# Patient Record
Sex: Male | Born: 1975 | Race: White | Hispanic: No | Marital: Single | Smoking: Current every day smoker
Health system: Southern US, Community
[De-identification: ages and names within clinical notes are randomized; demographics above are authoritative.]

## PROBLEM LIST (undated history)

## (undated) HISTORY — PX: HAND SURGERY: SHX662

## (undated) HISTORY — PX: TONSILLECTOMY: SUR1361

## (undated) HISTORY — PX: MANDIBLE SURGERY: SHX707

---

## 2003-01-12 ENCOUNTER — Emergency Department (HOSPITAL_COMMUNITY): Admission: EM | Admit: 2003-01-12 | Discharge: 2003-01-13 | Payer: Self-pay | Admitting: Emergency Medicine

## 2003-01-14 ENCOUNTER — Emergency Department (HOSPITAL_COMMUNITY): Admission: EM | Admit: 2003-01-14 | Discharge: 2003-01-14 | Payer: Self-pay | Admitting: Emergency Medicine

## 2007-06-06 ENCOUNTER — Encounter: Admission: RE | Admit: 2007-06-06 | Discharge: 2007-06-06 | Payer: Self-pay | Admitting: Family Medicine

## 2012-06-29 ENCOUNTER — Emergency Department: Payer: Self-pay | Admitting: Emergency Medicine

## 2012-06-29 LAB — CBC
HCT: 42.4 % (ref 40.0–52.0)
HGB: 14.5 g/dL (ref 13.0–18.0)
Platelet: 239 10*3/uL (ref 150–440)
RBC: 4.56 10*6/uL (ref 4.40–5.90)
RDW: 12.7 % (ref 11.5–14.5)

## 2012-06-30 LAB — COMPREHENSIVE METABOLIC PANEL
Alkaline Phosphatase: 79 U/L (ref 50–136)
Anion Gap: 4 — ABNORMAL LOW (ref 7–16)
Bilirubin,Total: 0.2 mg/dL (ref 0.2–1.0)
Co2: 26 mmol/L (ref 21–32)
EGFR (African American): >60
EGFR (Non-African Amer.): >60
Osmolality: 284 (ref 275–301)
SGOT(AST): 31 U/L (ref 15–37)
Sodium: 142 mmol/L (ref 136–145)

## 2012-06-30 LAB — URINALYSIS, COMPLETE
Bacteria: NONE SEEN
Bilirubin,UR: NEGATIVE
Glucose,UR: 150 mg/dL (ref 0–75)
Leukocyte Esterase: NEGATIVE
Ph: 8 (ref 4.5–8.0)
Protein: NEGATIVE
Specific Gravity: 1.01 (ref 1.003–1.030)
WBC UR: 2 /HPF (ref 0–5)

## 2012-06-30 LAB — TROPONIN I: Troponin-I: <0.02 ng/mL

## 2013-01-08 ENCOUNTER — Encounter (HOSPITAL_COMMUNITY): Payer: Self-pay | Admitting: Emergency Medicine

## 2013-01-08 ENCOUNTER — Emergency Department (HOSPITAL_COMMUNITY)
Admission: EM | Admit: 2013-01-08 | Discharge: 2013-01-08 | Disposition: A | Discharge status: Home / Self Care | Payer: 59 | Attending: Emergency Medicine | Admitting: Emergency Medicine

## 2013-01-08 DIAGNOSIS — Y9389 Activity, other specified: Secondary | ICD-10-CM | POA: Insufficient documentation

## 2013-01-08 DIAGNOSIS — T6391XA Toxic effect of contact with unspecified venomous animal, accidental (unintentional), initial encounter: Secondary | ICD-10-CM | POA: Insufficient documentation

## 2013-01-08 DIAGNOSIS — F17200 Nicotine dependence, unspecified, uncomplicated: Secondary | ICD-10-CM | POA: Insufficient documentation

## 2013-01-08 DIAGNOSIS — Y929 Unspecified place or not applicable: Secondary | ICD-10-CM | POA: Insufficient documentation

## 2013-01-08 DIAGNOSIS — R000 Tachycardia, unspecified: Secondary | ICD-10-CM | POA: Insufficient documentation

## 2013-01-08 DIAGNOSIS — Z91038 Other insect allergy status: Secondary | ICD-10-CM | POA: Insufficient documentation

## 2013-01-08 DIAGNOSIS — T63461A Toxic effect of venom of wasps, accidental (unintentional), initial encounter: Secondary | ICD-10-CM | POA: Insufficient documentation

## 2013-01-08 MED ORDER — TETANUS-DIPHTH-ACELL PERTUSSIS 5-2.5-18.5 LF-MCG/0.5 IM SUSP
0.5000 mL | Freq: Once | INTRAMUSCULAR | Status: AC
  Administered 2013-01-08: 0.5 mL via INTRAMUSCULAR
  Filled 2013-01-08: qty 0.5

## 2013-01-08 MED ORDER — EPINEPHRINE 0.3 MG/0.3ML IJ SOAJ: 0.3000 mg | INTRAMUSCULAR | Status: DC | PRN

## 2013-01-08 NOTE — ED Notes (Signed)
Pt from home, allergic to bees, was stung by yellow jacket approx PTA. Pt injected himself with epi-pen to R thigh when stung. Pt is A&O and in NAD

## 2013-01-08 NOTE — ED Provider Notes (Signed)
CSN: [id number]     Arrival date & time 01/08/13  1140 History   First MD Initiated Contact with Patient 01/08/13 1156     Chief Complaint  Patient presents with  . Insect Bite   (Consider location/radiation/quality/duration/timing/severity/associated sxs/prior Treatment) HPI Patient was stung by a bee at the dorsum of his right foot 20 minutes prior to arrival. He presently feels shaky no other complaint. No shortness of breath. No feeling of throat closing. No rash. He treated himself with an EpiPen immediately prior to coming here. History reviewed. No pertinent past medical history. Past Surgical History  Procedure Laterality Date  . Mandible surgery    . Hand surgery Right   . Tonsillectomy     anaphylactic reaction to bee sting No family history on file. History  Substance Use Topics  . Smoking status: Current Every Day Smoker -- 1.00 packs/day    Types: Cigarettes  . Smokeless tobacco: Not on file  . Alcohol Use: No   positive smoker no alcohol no drugs  Review of Systems  Constitutional: Negative.   HENT: Negative.   Respiratory: Negative.   Cardiovascular: Negative.   Gastrointestinal: Negative.   Musculoskeletal: Negative.   Skin: Negative.   Neurological: Negative.   Psychiatric/Behavioral: Negative.        Anxious  All other systems reviewed and are negative.    Allergies  Bee venom  Home Medications  No current outpatient prescriptions on file. BP 145/90  Pulse 115  Temp(Src) 98.4 F (36.9 C) (Axillary)  Resp 20  SpO2 100% Physical Exam  Nursing note and vitals reviewed. Constitutional: He is oriented to person, place, and time. He appears well-developed and well-nourished.  HENT:  Head: Normocephalic and atraumatic.  Eyes: Conjunctivae are normal. Pupils are equal, round, and reactive to light.  Neck: Neck supple. No tracheal deviation present. No thyromegaly present.  Cardiovascular: Regular rhythm.   No murmur heard. Mildly tachycardic   Pulmonary/Chest: Effort normal and breath sounds normal.  Abdominal: Soft. Bowel sounds are normal. He exhibits no distension. There is no tenderness.  Musculoskeletal: Normal range of motion. He exhibits no edema and no tenderness.  Neurological: He is alert and oriented to person, place, and time. Coordination normal.  Skin: Skin is warm and dry. No rash noted.  Is 1 mm punctate lesion over dorsum of right foot. No stinger present. No surrounding swelling redness or swelling. No stinger present  Psychiatric: He has a normal mood and affect.    ED Course  Procedures (including critical care time) Labs Review Labs Reviewed - No data to display Imaging Review No results found.  EKG Interpretation   None      3:10 PM patient is asymptomatic. Except for mild pain at right foot at site of bee sting. MDM  No diagnosis found. don't feel that patient sustained an anaphylactic reaction as result of today's bee sting Plan prescription EpiPen Referral wellness Center and resource guide to get primary care physician Mrs. bee sting of right foot    Doug Sou, MD 01/08/13 1514

## 2013-01-08 NOTE — ED Notes (Signed)
Pt escorted to discharge window. Pt verbalized understanding discharge instructions. In no acute distress.  

## 2013-01-08 NOTE — ED Notes (Signed)
Pt alert and oriented x4. Respirations even and unlabored, bilateral symmetrical rise and fall of chest. Skin warm and dry. In no acute distress. Denies needs.   

## 2015-12-17 ENCOUNTER — Emergency Department (HOSPITAL_BASED_OUTPATIENT_CLINIC_OR_DEPARTMENT_OTHER): Payer: Self-pay

## 2015-12-17 ENCOUNTER — Encounter (HOSPITAL_BASED_OUTPATIENT_CLINIC_OR_DEPARTMENT_OTHER): Payer: Self-pay | Admitting: Emergency Medicine

## 2015-12-17 ENCOUNTER — Emergency Department (HOSPITAL_BASED_OUTPATIENT_CLINIC_OR_DEPARTMENT_OTHER)
Admission: EM | Admit: 2015-12-17 | Discharge: 2015-12-17 | Disposition: A | Discharge status: Home / Self Care | Payer: Self-pay | Attending: Emergency Medicine | Admitting: Emergency Medicine

## 2015-12-17 ENCOUNTER — Inpatient Hospital Stay (HOSPITAL_BASED_OUTPATIENT_CLINIC_OR_DEPARTMENT_OTHER): Admit: 2015-12-17 | Payer: Self-pay

## 2015-12-17 DIAGNOSIS — E876 Hypokalemia: Secondary | ICD-10-CM

## 2015-12-17 DIAGNOSIS — R609 Edema, unspecified: Secondary | ICD-10-CM

## 2015-12-17 DIAGNOSIS — R600 Localized edema: Secondary | ICD-10-CM

## 2015-12-17 DIAGNOSIS — F17210 Nicotine dependence, cigarettes, uncomplicated: Secondary | ICD-10-CM | POA: Insufficient documentation

## 2015-12-17 DIAGNOSIS — Z79899 Other long term (current) drug therapy: Secondary | ICD-10-CM | POA: Insufficient documentation

## 2015-12-17 LAB — CBC WITH DIFFERENTIAL/PLATELET
BASOS ABS: 0.1 10*3/uL (ref 0.0–0.1)
BASOS PCT: 1 %
EOS ABS: 0.5 10*3/uL (ref 0.0–0.7)
Eosinophils Relative: 5 %
HEMATOCRIT: 37.1 % — AB (ref 39.0–52.0)
HEMOGLOBIN: 13.1 g/dL (ref 13.0–17.0)
Lymphocytes Relative: 37 %
Lymphs Abs: 3.9 10*3/uL (ref 0.7–4.0)
MCH: 33.2 pg (ref 26.0–34.0)
MCHC: 35.3 g/dL (ref 30.0–36.0)
MCV: 94.2 fL (ref 78.0–100.0)
MONOS PCT: 8 %
Monocytes Absolute: 0.9 10*3/uL (ref 0.1–1.0)
NEUTROS ABS: 5.1 10*3/uL (ref 1.7–7.7)
NEUTROS PCT: 49 %
Platelets: 386 10*3/uL (ref 150–400)
RBC: 3.94 MIL/uL — ABNORMAL LOW (ref 4.22–5.81)
RDW: 12 % (ref 11.5–15.5)
WBC: 10.4 10*3/uL (ref 4.0–10.5)

## 2015-12-17 LAB — BASIC METABOLIC PANEL
ANION GAP: 8 (ref 5–15)
BUN: 8 mg/dL (ref 6–20)
CALCIUM: 8.9 mg/dL (ref 8.9–10.3)
CO2: 26 mmol/L (ref 22–32)
CREATININE: 0.8 mg/dL (ref 0.61–1.24)
Chloride: 105 mmol/L (ref 101–111)
Glucose, Bld: 108 mg/dL — ABNORMAL HIGH (ref 65–99)
Potassium: 2.9 mmol/L — ABNORMAL LOW (ref 3.5–5.1)
SODIUM: 139 mmol/L (ref 135–145)

## 2015-12-17 MED ORDER — POTASSIUM CHLORIDE CRYS ER 20 MEQ PO TBCR: 20.0000 meq | EXTENDED_RELEASE_TABLET | Freq: Every day | ORAL | 0 refills | Status: DC

## 2015-12-17 MED ORDER — POTASSIUM CHLORIDE CRYS ER 20 MEQ PO TBCR
40.0000 meq | EXTENDED_RELEASE_TABLET | Freq: Once | ORAL | Status: AC
  Administered 2015-12-17: 40 meq via ORAL
  Filled 2015-12-17: qty 2

## 2015-12-17 MED ORDER — FUROSEMIDE 20 MG PO TABS: 20.0000 mg | ORAL_TABLET | Freq: Every day | ORAL | 0 refills | Status: DC

## 2015-12-17 NOTE — ED Provider Notes (Signed)
[facility]   CSN: [id number] Arrival date & time: 12/17/15  0258     History   Chief Complaint Chief Complaint  Patient presents with  . Leg Pain    HPI Erik Watson is a 40 y.o. male.  The history is provided by the patient.  Leg Pain   This is a new problem. The current episode started more than 2 days ago. The problem occurs daily. The problem has been gradually worsening. The pain is present in the left lower leg and right lower leg. The pain is moderate. He has tried nothing for the symptoms.   Patient reports bilateral LE swelling for one week He reports it is worsened with prolonged standing He reports he feels "swollen" from knees to ankles in both legs  He denies h/o CAD/PE/DVT/CHF  He also mentions intermittent blurred vision that has since resolved No CP/SOB reported but he reports frequent cough and difficulty lying flat.   PMH - none Past Surgical History:  Procedure Laterality Date  . HAND SURGERY Right   . MANDIBLE SURGERY    . TONSILLECTOMY         Home Medications    Prior to Admission medications   Medication Sig Start Date End Date Taking? Authorizing Provider  acetaminophen (TYLENOL) 500 MG tablet Take 1,000 mg by mouth every 6 (six) hours as needed for pain.    Historical Provider, MD  EPINEPHrine (EPI-PEN) 0.3 mg/0.3 mL SOAJ injection Inject 0.3 mg into the muscle once.    Historical Provider, MD  EPINEPHrine (EPIPEN) 0.3 mg/0.3 mL SOAJ injection Inject 0.3 mLs (0.3 mg total) into the muscle as needed. 01/08/13   Doug SouSam Jacubowitz, MD    Family History History reviewed. No pertinent family history.  Social History Social History  Substance Use Topics  . Smoking status: Current Every Day Smoker    Packs/day: 1.00    Types: Cigarettes  . Smokeless tobacco: Never Used  . Alcohol use No     Allergies   Bee venom   Review of Systems Review of Systems  Constitutional: Negative for fever.  Eyes:     Resolved blurred vision   Respiratory: Positive for cough.   Cardiovascular: Positive for leg swelling. Negative for chest pain.  Gastrointestinal: Negative for abdominal pain.  Psychiatric/Behavioral: The patient is nervous/anxious.   All other systems reviewed and are negative.    Physical Exam Updated Vital Signs BP (!) 132/101 (BP Location: Left Arm)   Pulse 98   Temp 98.1 F (36.7 C) (Oral)   Resp 20   Ht 5\' 7"  (1.702 m)   Wt 83.9 kg   SpO2 98%   BMI 28.98 kg/m   Physical Exam  CONSTITUTIONAL: Well developed/well nourished HEAD: Normocephalic/atraumatic EYES: EOMI/PERRL ENMT: Mucous membranes moist NECK: supple no meningeal signs, no JVD SPINE/BACK:entire spine nontender CV: S1/S2 noted, no murmurs/rubs/gallops noted LUNGS: Lungs are clear to auscultation bilaterally, no apparent distress ABDOMEN: soft, nontender, no rebound or guarding, bowel sounds noted throughout abdomen GU:no cva tenderness NEURO: Pt is awake/alert/appropriate, moves all extremitiesx4.  No facial droop.   EXTREMITIES: pulses normal/equal, full ROM, pitting edema to bilateral LE, left >right leg.  Mild erythema noted over both calves.  Minimal bilateral calf tenderness noted.  Distal pulses intact.  No abscess noted.  No crepitus noted SKIN: warm, color normal PSYCH: anxious  ED Treatments / Results  Labs (all labs ordered are listed, but only abnormal results are displayed) Labs Reviewed  BASIC METABOLIC  PANEL - Abnormal; Notable for the following:       Result Value   Potassium 2.9 (*)    Glucose, Bld 108 (*)    All other components within normal limits  CBC WITH DIFFERENTIAL/PLATELET - Abnormal; Notable for the following:    RBC 3.94 (*)    HCT 37.1 (*)    All other components within normal limits    EKG  EKG Interpretation None       Radiology Dg Chest 2 View  Result Date: 12/17/2015 CLINICAL DATA:  Acute onset of bilateral leg swelling and blurred vision. Initial  encounter. EXAM: CHEST  2 VIEW COMPARISON:  None. FINDINGS: The lungs are well-aerated and clear. There is no evidence of focal opacification, pleural effusion or pneumothorax. The heart is normal in size; the mediastinal contour is within normal limits. No acute osseous abnormalities are seen. IMPRESSION: No acute cardiopulmonary process seen. Electronically Signed   By: Roanna Raider M.D.   On: 12/17/2015 03:55    Procedures Procedures  Medications Ordered in ED Medications  potassium chloride SA (K-DUR,KLOR-CON) CR tablet 40 mEq (40 mEq Oral Given 12/17/15 0413)     Initial Impression / Assessment and Plan / ED Course  I have reviewed the triage vital signs and the nursing notes.  Pertinent labs & imaging results that were available during my care of the patient were reviewed by me and considered in my medical decision making (see chart for details).  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   He has bilateral edema,but left >right.  DVT studies ordered for later this morning as d-dimer not an option due to high risk status.  Since he will return in the next 4-5 hours, will defer empiric anticoagulation.  Will also briefly treat for peripheral edema with lasix and also KCL He needs to establish with PCP  Final Clinical Impressions(s) / ED Diagnoses   Final diagnoses:  Peripheral edema  Hypokalemia    New Prescriptions New Prescriptions   FUROSEMIDE (LASIX) 20 MG TABLET    Take 1 tablet (20 mg total) by mouth daily.   POTASSIUM CHLORIDE SA (K-DUR,KLOR-CON) 20 MEQ TABLET    Take 1 tablet (20 mEq total) by mouth daily.     Zadie Rhine, MD 12/17/15 [phone]

## 2015-12-17 NOTE — ED Triage Notes (Signed)
Pt reports leg swelling increased when he awakes in the AM and also with prolonged standing. Also reports occasional blurred vision

## 2015-12-18 ENCOUNTER — Ambulatory Visit (HOSPITAL_BASED_OUTPATIENT_CLINIC_OR_DEPARTMENT_OTHER)
Admission: RE | Admit: 2015-12-18 | Discharge: 2015-12-18 | Disposition: A | Discharge status: Home / Self Care | Payer: Self-pay | Source: Ambulatory Visit | Attending: Emergency Medicine | Admitting: Emergency Medicine

## 2015-12-18 DIAGNOSIS — R600 Localized edema: Secondary | ICD-10-CM | POA: Insufficient documentation

## 2016-08-16 ENCOUNTER — Emergency Department (HOSPITAL_BASED_OUTPATIENT_CLINIC_OR_DEPARTMENT_OTHER)
Admission: EM | Admit: 2016-08-16 | Discharge: 2016-08-16 | Disposition: A | Discharge status: Home / Self Care | Payer: Self-pay | Attending: Emergency Medicine | Admitting: Emergency Medicine

## 2016-08-16 ENCOUNTER — Encounter (HOSPITAL_BASED_OUTPATIENT_CLINIC_OR_DEPARTMENT_OTHER): Payer: Self-pay | Admitting: Emergency Medicine

## 2016-08-16 ENCOUNTER — Emergency Department (HOSPITAL_BASED_OUTPATIENT_CLINIC_OR_DEPARTMENT_OTHER): Payer: Self-pay

## 2016-08-16 DIAGNOSIS — F17210 Nicotine dependence, cigarettes, uncomplicated: Secondary | ICD-10-CM | POA: Insufficient documentation

## 2016-08-16 DIAGNOSIS — M5417 Radiculopathy, lumbosacral region: Secondary | ICD-10-CM | POA: Insufficient documentation

## 2016-08-16 MED ORDER — NAPROXEN 500 MG PO TABS: 500.0000 mg | ORAL_TABLET | Freq: Two times a day (BID) | ORAL | 0 refills | Status: AC

## 2016-08-16 MED ORDER — KETOROLAC TROMETHAMINE 60 MG/2ML IM SOLN
30.0000 mg | Freq: Once | INTRAMUSCULAR | Status: AC
  Administered 2016-08-16: 30 mg via INTRAMUSCULAR
  Filled 2016-08-16: qty 2

## 2016-08-16 MED ORDER — METHOCARBAMOL 500 MG PO TABS: 500.0000 mg | ORAL_TABLET | Freq: Two times a day (BID) | ORAL | 0 refills | Status: AC

## 2016-08-16 MED ORDER — METHOCARBAMOL 500 MG PO TABS
750.0000 mg | ORAL_TABLET | Freq: Once | ORAL | Status: AC
  Administered 2016-08-16: 750 mg via ORAL
  Filled 2016-08-16: qty 2

## 2016-08-16 MED ORDER — PREDNISONE 10 MG (21) PO TBPK: ORAL_TABLET | Freq: Every day | ORAL | 0 refills | Status: AC

## 2016-08-16 NOTE — ED Notes (Signed)
Placed in wheelchair on arrival.

## 2016-08-16 NOTE — Discharge Instructions (Signed)
Naprosyn for pain and inflammation. Prednisone until all gone. Robaxin for spasms. Follow up with family doctor if not improving.

## 2016-08-16 NOTE — ED Notes (Signed)
ED Provider at bedside. 

## 2016-08-16 NOTE — ED Triage Notes (Signed)
L hip pain since yesterday after doing yard work.

## 2016-08-16 NOTE — ED Provider Notes (Signed)
[facility]   CSN: [id number] Arrival date & time: 08/16/16  1723   By signing my name below, I, Soijett Blue, attest that this documentation has been prepared under the direction and in the presence of Jaynie Crumbleatyana Maekayla Giorgio, [employer]F CorporationPA-C Electronically Signed: Soijett Blue, ED Scribe. 08/16/16. 6:11 PM.  History   Chief Complaint Chief Complaint  Patient presents with  . Hip Pain    HPI Erik Watson is a 41 y.o. male who presents to the Emergency Department complaining of left hip pain onset yesterday worsening today. Pt reports associated left lower back pain and left leg pain. Pt has not tried any medications for the relief of his symptoms. He notes that he was doing yard work prior to the onset of his symptoms. He reports that his left hip pain radiates to his left lower back and left leg. 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 He reports that he was evaluated by a chiropractor following the injury. He denies abdominal pain, difficulty urinating, bowel/bladder incontinence, numbness, tingling, recent fall, and any other symptoms. Denies having a PCP.   The history is provided by the patient. No language interpreter was used.    History reviewed. No pertinent past medical history.  There are no active problems to display for this patient.   Past Surgical History:  Procedure Laterality Date  . HAND SURGERY Right   . MANDIBLE SURGERY    . TONSILLECTOMY         Home Medications    Prior to Admission medications   Medication Sig Start Date End Date Taking? Authorizing Provider  acetaminophen (TYLENOL) 500 MG tablet Take 1,000 mg by mouth every 6 (six) hours as needed for pain.    [provider]  EPINEPHrine (EPI-PEN) 0.3 mg/0.3 mL SOAJ injection Inject 0.3 mg into the muscle once.    [provider]    Family History No family  history on file.  Social History Social History  Substance Use Topics  . Smoking status: Current Every Day Smoker    Packs/day: 1.00    Types: Cigarettes  . Smokeless tobacco: Never Used  . Alcohol use Yes     Allergies   Bee venom   Review of Systems Review of Systems  Gastrointestinal: Negative for abdominal pain.       No bowel incontinence.   Genitourinary: Negative for difficulty urinating and scrotal swelling.       No bladder incontinence.   Musculoskeletal: Positive for arthralgias (left hip and left leg), back pain (lower) and gait problem.  Neurological: Negative for weakness and numbness.       No tingling  All other systems reviewed and are negative.    Physical Exam Updated Vital Signs BP 104/73 (BP Location: Left Arm)   Pulse 92   Temp 98.1 F (36.7 C) (Oral)   Resp 18   Ht 5\' 7"  (1.702 m)   Wt 170 lb (77.1 kg)   SpO2 100%   BMI 26.63 kg/m   Physical Exam  Constitutional: He is oriented to person, place, and time. He appears well-developed and well-nourished. No distress.  HENT:  Head: Normocephalic and atraumatic.  Eyes: EOM are normal.  Neck: Neck supple.  Cardiovascular: Normal rate.   Pulmonary/Chest: Effort normal. No respiratory distress.  Abdominal: He exhibits no distension.  Musculoskeletal: Normal range of motion.  Midline  lumbar spine tenderness. Tenderness to palpation on left SI joint and left buttock. No tenderness over lateral left hip. Pain with left hip flexion and extension. No pain with internal/external rotation of the hip joint.  Neurological: He is alert and oriented to person, place, and time.  5/5 and equal lower extremity strength. 2+ and equal patellar reflexes bilaterally. Pt able to dorsiflex bilateral toes and feet with good strength against resistance. Equal sensation bilaterally over thighs and lower legs.   Skin: Skin is warm and dry.  Psychiatric: He has a normal mood and affect. His behavior is normal.  Nursing  note and vitals reviewed.    ED Treatments / Results  DIAGNOSTIC STUDIES: Oxygen Saturation is 100% on RA, nl by my interpretation.    COORDINATION OF CARE: 6:09 PM Discussed treatment plan with pt at bedside which includes left hip xray, lumbar spine xray, and pt agreed to plan.   Labs (all labs ordered are listed, but only abnormal results are displayed) Labs Reviewed - No data to display  EKG  EKG Interpretation None       Radiology Dg Lumbar Spine Complete  Result Date: 08/16/2016 CLINICAL DATA:  Low back pain, no known injury EXAM: LUMBAR SPINE - COMPLETE 4+ VIEW COMPARISON:  None. FINDINGS: Five lumbar-type vertebral bodies. Normal lumbar lordosis. No evidence of fracture or dislocation. Vertebral body heights are maintained. Mild degenerative changes at L4-5 and L5-S1. Visualized bony pelvis appears intact. IMPRESSION: No fracture or dislocation is seen. Electronically Signed   By: Charline Bills M.D.   On: 08/16/2016 18:42   Dg Hip Unilat W Or Wo Pelvis 2-3 Views Left  Result Date: 08/16/2016 CLINICAL DATA:  Acute onset left hip pain EXAM: DG HIP (WITH OR WITHOUT PELVIS) 2-3V LEFT COMPARISON:  None. FINDINGS: There is no evidence of hip fracture or dislocation. There is no evidence of arthropathy or other focal bone abnormality. IMPRESSION: Normal left hip. Electronically Signed   By: Deatra Robinson M.D.   On: 08/16/2016 18:41    Procedures Procedures (including critical care time)  Medications Ordered in ED Medications - No data to display   Initial Impression / Assessment and Plan / ED Course  I have reviewed the triage vital signs and the nursing notes.  Pertinent labs & imaging results that were available during my care of the patient were reviewed by me and considered in my medical decision making (see chart for details).     Patient in emergency department with radicular pain from lower back into the left hip and left anterior thigh. He is  neurovascularly intact. No evidence of cauda equina based on exam and history. I'm not concerned about cord compression. X-rays of the lumbar spine and hip obtained in show no significant abnormalities other than mild degenerative changes at L4-5 and L5-S1. He was given Toradol and Robaxin emergency department. We'll discharge home with NSAIDs, steroids, Robaxin for muscle spasms. Will get a printout of back exercises. Asked to try heating pads and stretches. Follow-up with family doctor if not improving.   Vitals:   08/16/16 1728 08/16/16 1929  BP: 104/73 106/63  Pulse: 92 72  Resp: 18 16  Temp: 98.1 F (36.7 C)   TempSrc: Oral   SpO2: 100% 100%  Weight: 77.1 kg (170 lb)   Height: 5\' 7"  (1.702 m)     Final Clinical Impressions(s) / ED Diagnoses   Final diagnoses:  Lumbosacral radiculopathy    New Prescriptions Discharge Medication List as of 08/16/2016  7:12 PM    START taking these medications   Details  methocarbamol (ROBAXIN) 500 MG tablet Take 1 tablet (500 mg total) by mouth 2 (two) times daily., Starting Sun 08/16/2016, Print    naproxen (NAPROSYN) 500 MG tablet Take 1 tablet (500 mg total) by mouth 2 (two) times daily., Starting Sun 08/16/2016, Print    predniSONE (STERAPRED UNI-PAK 21 TAB) 10 MG (21) TBPK tablet Take by mouth daily. Take 6 tabs by mouth daily  for 2 days, then 5 tabs for 2 days, then 4 tabs for 2 days, then 3 tabs for 2 days, 2 tabs for 2 days, then 1 tab by mouth daily for 2 days, Starting Sun 08/16/2016, Print      I personally performed the services described in this documentation, which was scribed in my presence. The recorded information has been reviewed and is accurate.    Jaynie Crumble, PA-C 08/17/16 0103    Maia Plan, MD 08/17/16 1049

## 2017-12-13 IMAGING — CR DG LUMBAR SPINE COMPLETE 4+V
5 series · 5 of 5 positions shown · non-contrast
Comparison: None.

CLINICAL DATA: Low back pain, no known injury

EXAM:
LUMBAR SPINE - COMPLETE 4+ VIEW

[t l-spine a.p.]
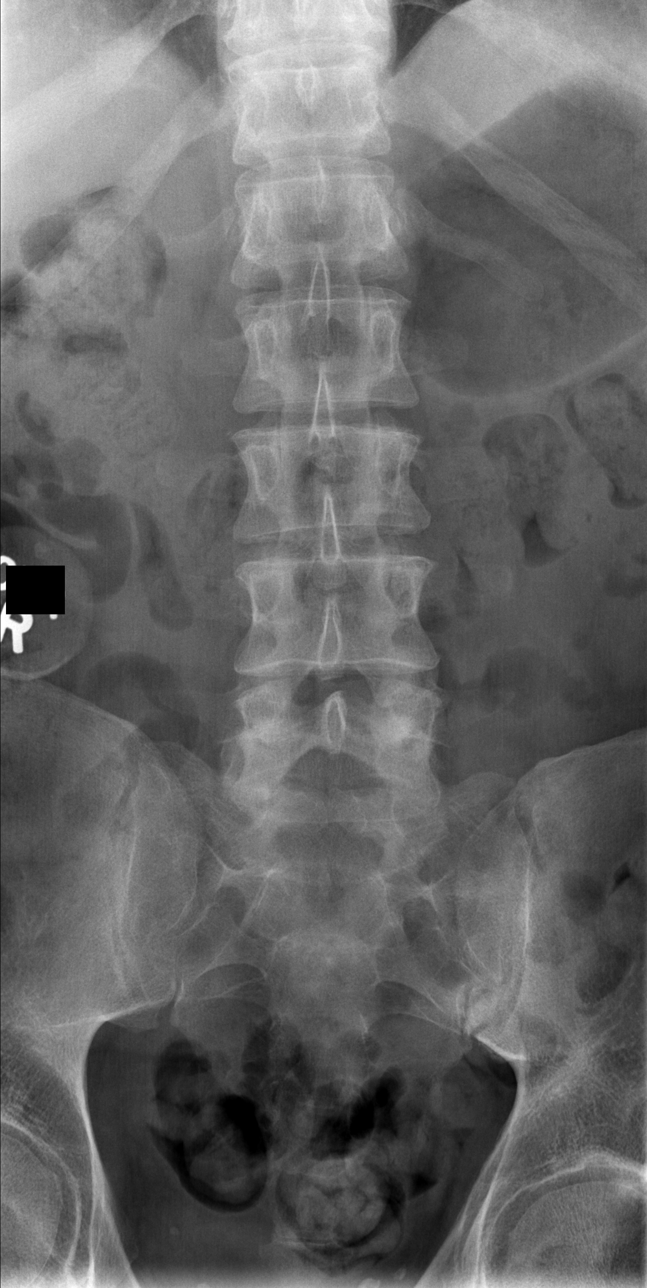

[t l-spine oblique exposure (1 of 2)]
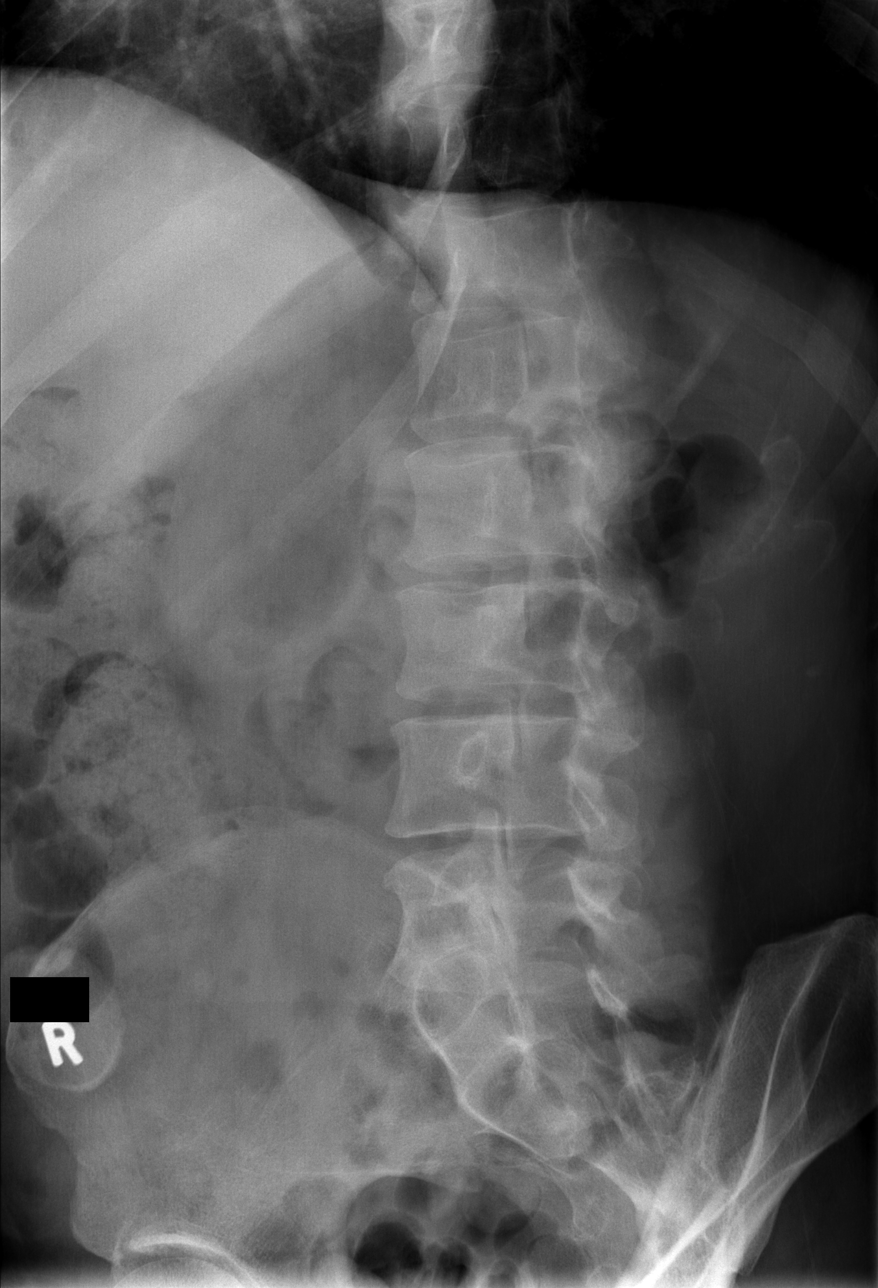

[t l-spine oblique exposure (2 of 2)]
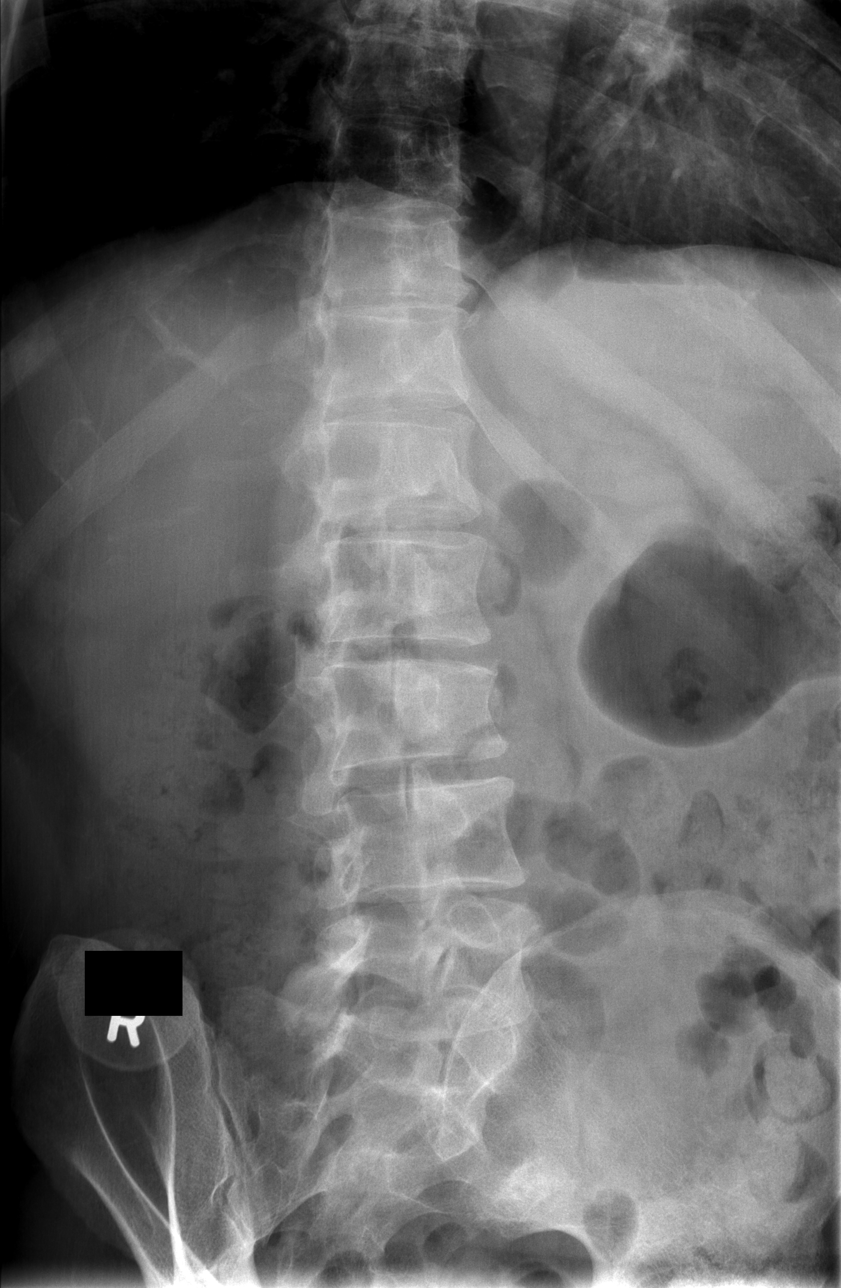

[t l-spine lat]
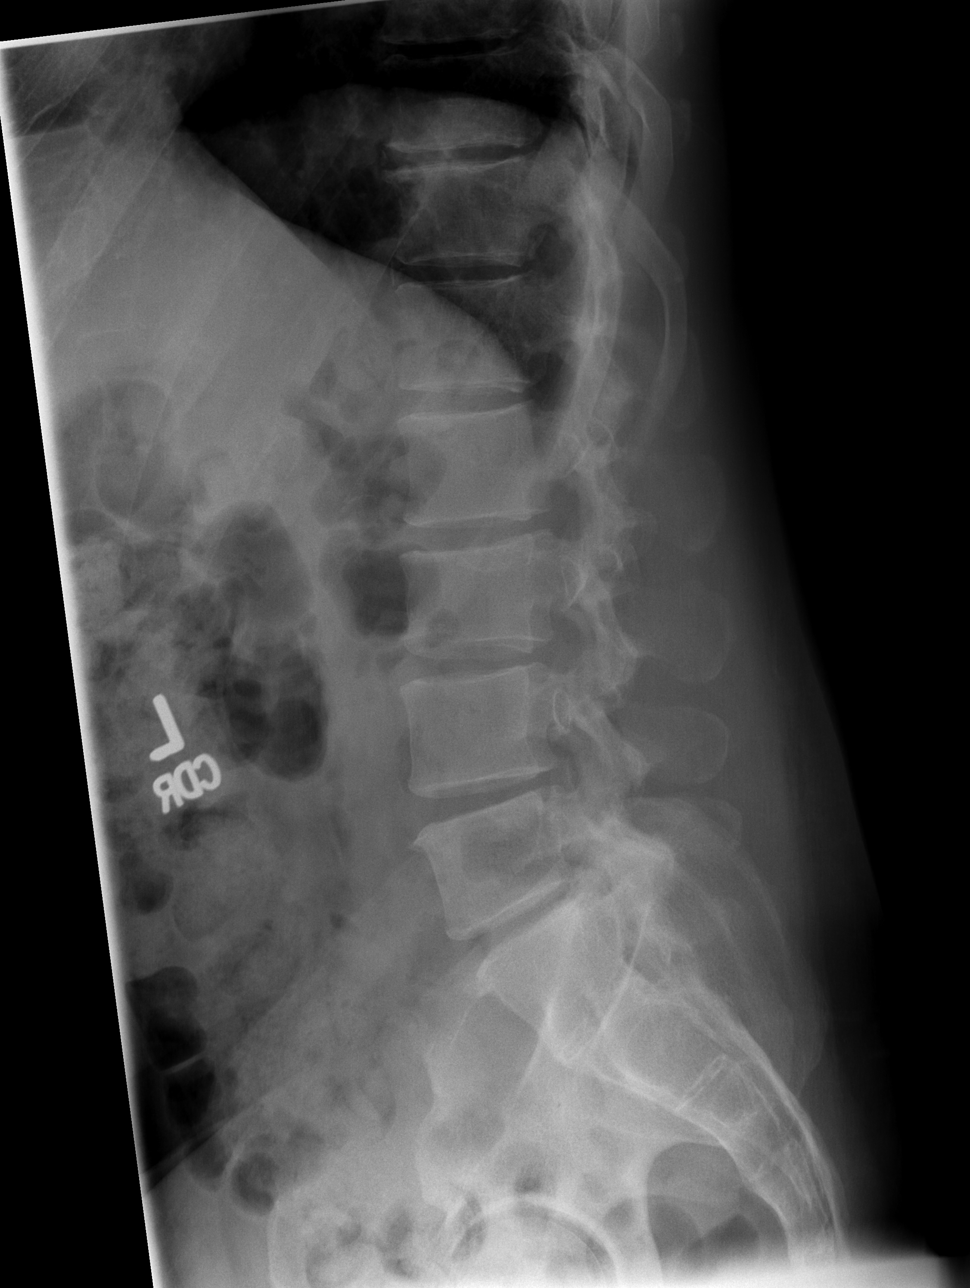

[t l-spine l5-s1 spot]
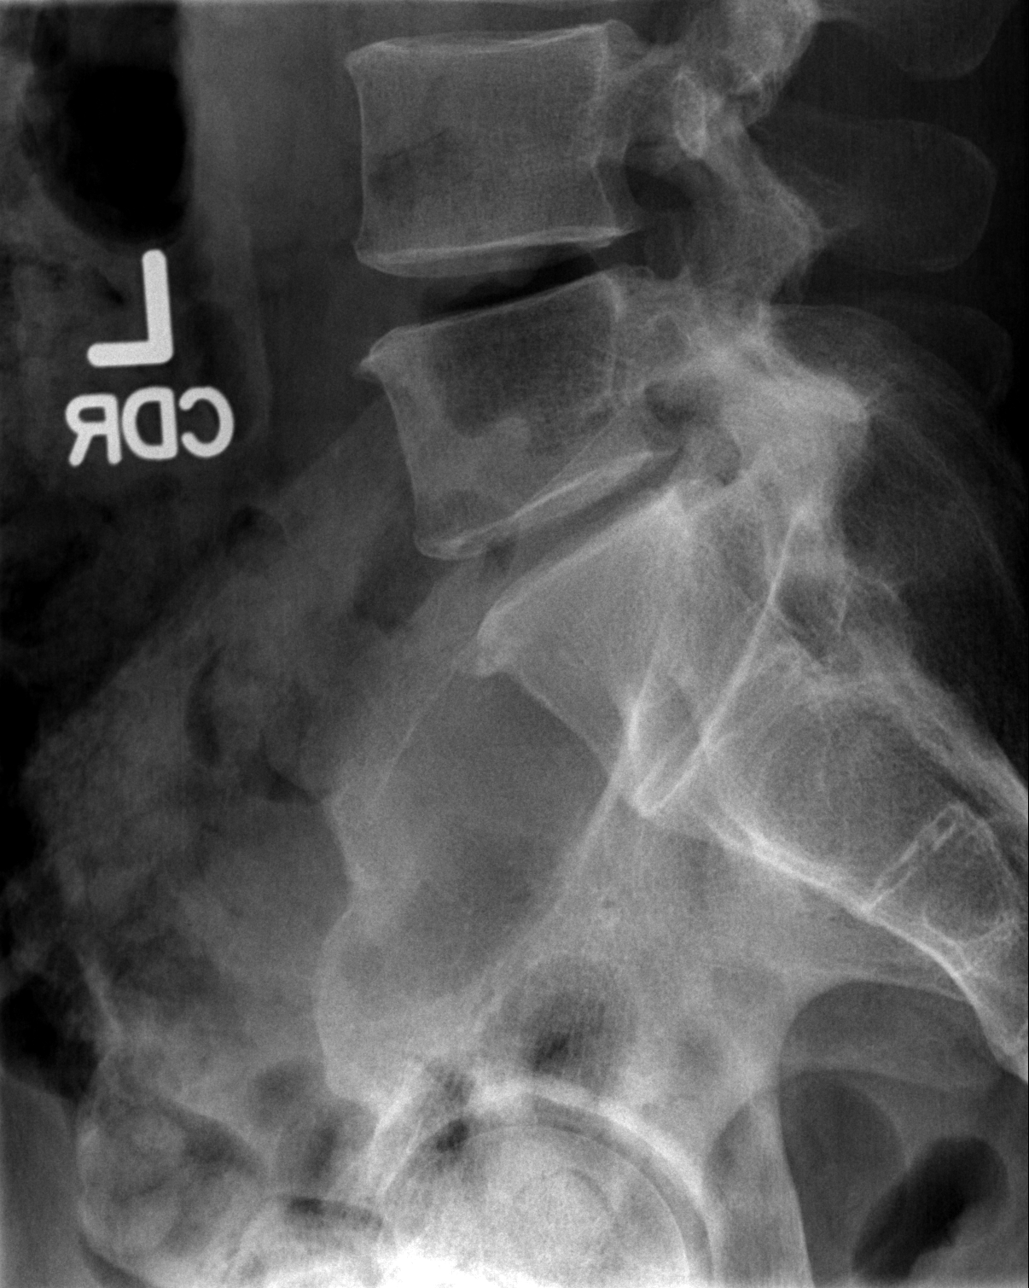

[5 of 5 positions shown; findings below may reference images not displayed]

FINDINGS: Five lumbar-type vertebral bodies.

Normal lumbar lordosis.

No evidence of fracture or dislocation. Vertebral body heights are
maintained.

Mild degenerative changes at L4-5 and L5-S1.

Visualized bony pelvis appears intact.
IMPRESSION: No fracture or dislocation is seen.
